# Patient Record
Sex: Male | Born: 1985 | Race: White | Marital: Married | Smoking: Former smoker
Health system: Southern US, Community
[De-identification: ages and names within clinical notes are randomized; demographics above are authoritative.]

---

## 2015-04-06 ENCOUNTER — Encounter (INDEPENDENT_AMBULATORY_CARE_PROVIDER_SITE_OTHER): Payer: Self-pay

## 2015-04-06 ENCOUNTER — Ambulatory Visit (INDEPENDENT_AMBULATORY_CARE_PROVIDER_SITE_OTHER): Payer: BC Managed Care – PPO | Admitting: Internal Medicine

## 2015-04-06 ENCOUNTER — Other Ambulatory Visit (INDEPENDENT_AMBULATORY_CARE_PROVIDER_SITE_OTHER): Payer: BC Managed Care – PPO

## 2015-04-06 ENCOUNTER — Encounter: Payer: Self-pay | Admitting: Internal Medicine

## 2015-04-06 VITALS — BP 108/70 | HR 85 | Ht 71.0 in | Wt 149.0 lb

## 2015-04-06 DIAGNOSIS — J45991 Cough variant asthma: Secondary | ICD-10-CM

## 2015-04-06 DIAGNOSIS — J310 Chronic rhinitis: Secondary | ICD-10-CM | POA: Diagnosis not present

## 2015-04-06 LAB — CBC WITH DIFFERENTIAL/PLATELET
BASOS ABS: 0 10*3/uL (ref 0.0–0.1)
Basophils Relative: 0.6 % (ref 0.0–3.0)
EOS ABS: 0.1 10*3/uL (ref 0.0–0.7)
Eosinophils Relative: 1.9 % (ref 0.0–5.0)
HEMATOCRIT: 42.7 % (ref 39.0–52.0)
HEMOGLOBIN: 14.5 g/dL (ref 13.0–17.0)
LYMPHS PCT: 30.3 % (ref 12.0–46.0)
Lymphs Abs: 1.2 10*3/uL (ref 0.7–4.0)
MCHC: 34 g/dL (ref 30.0–36.0)
MCV: 88.6 fl (ref 78.0–100.0)
Monocytes Absolute: 0.5 10*3/uL (ref 0.1–1.0)
Monocytes Relative: 13 % — ABNORMAL HIGH (ref 3.0–12.0)
Neutro Abs: 2.2 10*3/uL (ref 1.4–7.7)
Neutrophils Relative %: 54.2 % (ref 43.0–77.0)
Platelets: 239 10*3/uL (ref 150.0–400.0)
RBC: 4.82 Mil/uL (ref 4.22–5.81)
RDW: 12.7 % (ref 11.5–15.5)
WBC: 4.1 10*3/uL (ref 4.0–10.5)

## 2015-04-06 MED ORDER — AZELASTINE-FLUTICASONE 137-50 MCG/ACT NA SUSP: 1.0000 | Freq: Two times a day (BID) | NASAL | Status: DC

## 2015-04-06 MED ORDER — FAMOTIDINE 20 MG PO TABS: ORAL_TABLET | ORAL | Status: AC

## 2015-04-06 MED ORDER — PANTOPRAZOLE SODIUM 40 MG PO TBEC: 40.0000 mg | DELAYED_RELEASE_TABLET | Freq: Every day | ORAL | Status: AC

## 2015-04-06 NOTE — Progress Notes (Signed)
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 Cough variant asthma.   04/06/2015 1st  Pulmonary office visit/ Hai Grabe   Chief Complaint  Patient presents with  . Pulmonary Consult    Referred by Dr. Merri Brunette. Pt c/o cough x 1 yr, worse recently with warmer weather. Cough is typically non prod and seems worse in the am. He has noticed that he coughs more after eating and showering.   coughing daily x decade, never noct worse p stir in am Narda Bonds after bfast  Allergy testing 2012 in [place] neg  Neg resp to trial with ppi  No obvious other patterns in day to day or daytime variabilty or assoc excess/ purulent sputum or mucus plugs  or cp or chest tightness, subjective wheeze overt sinus or hb symptoms. No unusual exp hx or h/o childhood pna/ asthma or knowledge of premature birth.  Sleeping ok without nocturnal  or early am exacerbation  of respiratory  c/o's or need for noct saba. Also denies any obvious fluctuation of symptoms with weather or environmental changes or other aggravating or alleviating factors except as outlined above   Current Medications, Allergies, Complete Past Medical History, Past Surgical History, Family History, and Social History were reviewed in [employer] [facility] record.                Review of Systems  Constitutional: Negative for fever,  chills, activity change, appetite change and unexpected weight change.  HENT: Positive for congestion, sneezing and sore throat. Negative for dental problem, postnasal drip, rhinorrhea, trouble swallowing and voice change.   Eyes: Negative for visual disturbance.  Respiratory: Positive for cough. Negative for choking and shortness of breath.   Cardiovascular: Negative for chest pain and leg swelling.  Gastrointestinal: Negative for nausea, vomiting and abdominal pain.  Genitourinary: Negative for difficulty urinating.  Musculoskeletal: Negative for arthralgias.  Skin: Negative for rash.  Psychiatric/Behavioral: Negative for behavioral problems and confusion.       Objective:   Physical Exam  amb wm nad  Wt Readings from Last 3 Encounters:  04/06/15 149 lb (67.586 kg)    Vital signs reviewed   HEENT: nl dentition, turbinates, and oropharynx. Nl external ear canals without cough reflex   NECK :  without JVD/Nodes/TM/ nl carotid upstrokes bilaterally   LUNGS: no acc muscle use,  Nl contour chest which is clear to A and P bilaterally without cough on insp or exp maneuvers   CV:  RRR  no s3 or murmur or increase in P2, no edema   ABD:  soft and nontender with nl inspiratory excursion in the supine position. No bruits or organomegaly, bowel sounds nl  MS:  Nl gait/ ext warm without deformities, calf tenderness, cyanosis or clubbing No obvious joint restrictions   SKIN: warm  and dry without lesions    NEURO:  alert, approp, nl sensorium with  no motor deficits     Labs ordered 04/06/2015 = Eos 0.1/ allergy profile       Assessment & Plan:

## 2015-04-06 NOTE — Patient Instructions (Addendum)
Stop nasonex and start dymista one twice daily on a trial basis and fill the prescription if you like it   Pantoprazole (protonix) 40 mg   Take  30-60 min before first meal of the day and Pepcid (famotidine)  20 mg one @  bedtime until you have your methacholine challenge testing - this is the best way to tell whether stomach acid is contributing to your problem.    GERD (REFLUX)  is an extremely common cause of respiratory symptoms just like yours , many times with no obvious heartburn at all.    It can be treated with medication, but also with lifestyle changes including elevation of the head of your bed (ideally with 6 inch  bed blocks),  Smoking cessation, avoidance of late meals, excessive alcohol, and avoid fatty foods, chocolate, peppermint, colas, red wine, and acidic juices such as orange juice.  NO MINT OR MENTHOL PRODUCTS SO NO COUGH DROPS  USE SUGARLESS CANDY INSTEAD (Jolley ranchers or Stover's or Life Savers) or even ice chips will also do - the key is to swallow to prevent all throat clearing. NO OIL BASED VITAMINS - use powdered substitutes.  Please see patient coordinator before you leave today  to schedule methacholine challenge test - in two weeks, no sooner - I will call you with results   Please remember to go to the lab   department downstairs for your tests - we will call you with the results when they are available.

## 2015-04-07 DIAGNOSIS — J310 Chronic rhinitis: Secondary | ICD-10-CM | POA: Insufficient documentation

## 2015-04-07 NOTE — Assessment & Plan Note (Addendum)
Spirometry 04/06/2015  Completely wnl including fef 25-75  Allergy profile 04/06/2015 >  Eos 0.1 /  IgE  Pending   Symptoms are markedly disproportionate to objective findings and not clear this is a lung problem but pt does appear to have difficult airway management issues. DDX of  difficult airways management almost all start with A and  include Adherence, Ace Inhibitors, Acid Reflux, Active Sinus Disease, Alpha 1 Antitripsin deficiency, Anxiety masquerading as Airways dz,  ABPA,  Allergy(esp in young), Aspiration (esp in elderly), Adverse effects of meds,  Active smokers, A bunch of PE's (a small clot burden can't cause this syndrome unless there is already severe underlying pulm or vascular dz with poor reserve) plus two Bs  = Bronchiectasis and Beta blocker use..and one C= CHF  Adherence is always the initial "prime suspect" and is a multilayered concern that requires a "trust but verify" approach in every patient - starting with knowing how to use medications, especially inhalers, correctly, keeping up with refills and understanding the fundamental difference between maintenance and prns vs those medications only taken for a very short course and then stopped and not refilled.   ? Acid (or non-acid) GERD > always difficult to exclude as up to 75% of pts in some series report no assoc GI/ Heartburn symptoms> rec max (24h)  acid suppression and diet restrictions (for nonacid GERD/ reviewed and instructions given in writing > needs to stay on these until after Methacholine challenge then ok to d/c  ? Allergy/asthma > check profile, proceed with MCT  ? Anxiety > usually at the bottom of this list of usual suspects but should be much higher on this pt's based on H and P  > Follow up per Primary Care planned    Total time devoted to counseling  = 35/57m review case with pt/ discussion of options/alternatives/ personally creating in presence of pt  then going over specific  Instructions directly with the  pt including how to use all of the meds but in particular covering each new medication in detail (see avs)

## 2015-04-07 NOTE — Assessment & Plan Note (Signed)
Trial of dymista 04/06/2015 >>>  Consider referral to [place] if allergy studies all neg

## 2015-04-09 LAB — RESPIRATORY ALLERGY PROFILE REGION II ~~LOC~~
Allergen, Cedar tree, t12: <0.1 kU/L
Allergen, D pternoyssinus,d7: <0.1 kU/L
Allergen, Mouse Urine Protein, e78: <0.1 kU/L
Allergen, Oak,t7: <0.1 kU/L
Cat Dander: <0.1 kU/L
D. farinae: <0.1 kU/L
Dog Dander: <0.1 kU/L
Elm IgE: <0.1 kU/L
IGE (IMMUNOGLOBULIN E), SERUM: 4 kU/L (ref <115)
Johnson Grass: <0.1 kU/L
Pecan/Hickory Tree IgE: <0.1 kU/L
Rough Pigweed  IgE: <0.1 kU/L
Sheep Sorrel IgE: <0.1 kU/L
Timothy Grass: <0.1 kU/L

## 2015-04-09 NOTE — Progress Notes (Signed)
Quick Note:  Spoke with pt and notified of results per Dr. Wert. Pt verbalized understanding and denied any questions.  ______ 

## 2015-04-25 ENCOUNTER — Ambulatory Visit (HOSPITAL_COMMUNITY)
Admission: RE | Admit: 2015-04-25 | Discharge: 2015-04-25 | Disposition: A | Discharge status: Home / Self Care | Payer: BC Managed Care – PPO | Source: Ambulatory Visit | Attending: Internal Medicine | Admitting: Internal Medicine

## 2015-04-25 DIAGNOSIS — J45991 Cough variant asthma: Secondary | ICD-10-CM

## 2015-04-25 LAB — PULMONARY FUNCTION TEST
FEF 25-75 POST: 5.78 L/s
FEF 25-75 Pre: 6.08 L/sec
FEF2575-%Change-Post: -4 %
FEF2575-%Pred-Post: 125 %
FEF2575-%Pred-Pre: 131 %
FEV1-%Change-Post: -2 %
FEV1-%PRED-PRE: 113 %
FEV1-%Pred-Post: 110 %
FEV1-POST: 4.99 L
FEV1-PRE: 5.13 L
FEV1FVC-%Change-Post: 4 %
FEV1FVC-%Pred-Pre: 110 %
FEV6-%CHANGE-POST: -6 %
FEV6-%PRED-PRE: 103 %
FEV6-%Pred-Post: 96 %
FEV6-POST: 5.27 L
FEV6-Pre: 5.66 L
FEV6FVC-%PRED-POST: 101 %
FEV6FVC-%PRED-PRE: 101 %
FVC-%Change-Post: -6 %
FVC-%PRED-POST: 95 %
FVC-%PRED-PRE: 102 %
FVC-PRE: 5.66 L
FVC-Post: 5.27 L
POST FEV6/FVC RATIO: 100 %
PRE FEV1/FVC RATIO: 91 %
Post FEV1/FVC ratio: 95 %
Pre FEV6/FVC Ratio: 100 %

## 2015-04-25 MED ORDER — METHACHOLINE 16 MG/ML NEB SOLN
2.0000 mL | Freq: Once | RESPIRATORY_TRACT | Status: AC
  Administered 2015-04-25: 32 mg via RESPIRATORY_TRACT

## 2015-04-25 MED ORDER — ALBUTEROL SULFATE (2.5 MG/3ML) 0.083% IN NEBU
2.5000 mg | INHALATION_SOLUTION | Freq: Once | RESPIRATORY_TRACT | Status: AC
  Administered 2015-04-25: 2.5 mg via RESPIRATORY_TRACT

## 2015-04-25 MED ORDER — METHACHOLINE 1 MG/ML NEB SOLN
2.0000 mL | Freq: Once | RESPIRATORY_TRACT | Status: AC
  Administered 2015-04-25: 2 mg via RESPIRATORY_TRACT

## 2015-04-25 MED ORDER — METHACHOLINE 0.25 MG/ML NEB SOLN
2.0000 mL | Freq: Once | RESPIRATORY_TRACT | Status: AC
  Administered 2015-04-25: 0.5 mg via RESPIRATORY_TRACT

## 2015-04-25 MED ORDER — METHACHOLINE 4 MG/ML NEB SOLN
2.0000 mL | Freq: Once | RESPIRATORY_TRACT | Status: AC
  Administered 2015-04-25: 8 mg via RESPIRATORY_TRACT

## 2015-04-25 MED ORDER — SODIUM CHLORIDE 0.9 % IN NEBU
3.0000 mL | INHALATION_SOLUTION | Freq: Once | RESPIRATORY_TRACT | Status: AC
  Administered 2015-04-25: 3 mL via RESPIRATORY_TRACT

## 2015-04-25 MED ORDER — METHACHOLINE 0.0625 MG/ML NEB SOLN
2.0000 mL | Freq: Once | RESPIRATORY_TRACT | Status: AC
  Administered 2015-04-25: 0.125 mg via RESPIRATORY_TRACT

## 2015-04-26 ENCOUNTER — Telehealth: Payer: Self-pay | Admitting: Internal Medicine

## 2015-04-26 NOTE — Telephone Encounter (Signed)
Spoke with pt, aware of results/recs.  Scheduled rov with MW.  Nothing further needed.

## 2015-04-26 NOTE — Telephone Encounter (Signed)
Patient returned call, CB is [phone]

## 2015-04-26 NOTE — Telephone Encounter (Signed)
Per PFT results: Result Note     Call patient : Study is unremarkable, no evidence asthma > needs f/u to go over in more detail/ plan going forward  --  LMOMTCB x1

## 2015-05-18 ENCOUNTER — Encounter: Payer: Self-pay | Admitting: Internal Medicine

## 2015-05-18 ENCOUNTER — Ambulatory Visit (INDEPENDENT_AMBULATORY_CARE_PROVIDER_SITE_OTHER): Payer: BC Managed Care – PPO | Admitting: Internal Medicine

## 2015-05-18 VITALS — BP 110/82 | HR 88 | Ht 71.0 in | Wt 146.6 lb

## 2015-05-18 DIAGNOSIS — J310 Chronic rhinitis: Secondary | ICD-10-CM

## 2015-05-18 DIAGNOSIS — J45991 Cough variant asthma: Secondary | ICD-10-CM | POA: Diagnosis not present

## 2015-05-18 NOTE — Patient Instructions (Addendum)
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

## 2015-05-18 NOTE — Progress Notes (Signed)
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 Cough variant asthma.   04/06/2015 1st [place] [facility] office visit/ Stephen Gallagher   Chief Complaint  Patient presents with  . Pulmonary Consult    Referred by Dr. Merri BrunetteWalter Pharr. Pt c/o cough x 1 yr, worse recently with warmer weather. Cough is typically non prod and seems worse in the am. He has noticed that he coughs more after eating and showering.   coughing daily x decade, never noct worse p stir in am Narda Bonds/worse after bfast and after shower  [place]ergy testing 2012 in [place] neg skin pricks 2012 Neg resp to trial with ppi rec Stop nasonex and start dymista one twice daily on a trial basis and fill the prescription if you like it  Pantoprazole (protonix) 40 mg   Take  30-60 min before first meal of the day and Pepcid (famotidine)  20 mg one @  bedtime until you have your methacholine challenge testing - this is the best way to tell whether stomach acid is contributing to your problem.   GERD diet  mct on reflux > neg MCT    05/18/2015  f/u ov/Stephen Gallagher re: cough x 10 years  Chief Complaint  Patient presents with  . Follow-up    Cough is the same since last OV- no change. Pt reports having the Flu last week - some incr cough since flu and diagnosed with pharyngitis. Completed Amox 500mg  today   cough is dry/ no  better with dymista although did well in terms of stopping sensation of dripping/  Cough is daily regardless of environment /weather  No obvious day to day or daytime variability or assoc sob  or cp or chest tightness, subjective wheeze or overt sinus or hb symptoms. No unusual exp hx or h/o childhood pna/ asthma or knowledge of premature birth.  Sleeping ok without nocturnal  or early am exacerbation  of respiratory  c/o's or need for noct saba. Also denies any obvious fluctuation of symptoms with weather or environmental changes or other aggravating or alleviating factors except as outlined above   Current Medications, Allergies, Complete Past Medical History, Past Surgical History, Family History, and Social History were reviewed in [facility] electronic medical record.  ROS  The following are not active complaints unless bolded sore throat, dysphagia, dental problems, itching, sneezing,  nasal congestion or excess/ purulent secretions, ear ache,   fever, chills, sweats, unintended wt loss, classically pleuritic or exertional cp, hemoptysis,  orthopnea pnd or leg swelling, presyncope, palpitations, abdominal pain, anorexia, nausea, vomiting, diarrhea  or change in bowel or bladder habits, change in stools or urine, dysuria,hematuria,  rash, arthralgias, visual complaints, headache, numbness, weakness or ataxia or problems with walking or coordination,  change in  mood/affect or memory.           Objective:   Physical Exam  amb wm nad  Wt Readings from Last 3 Encounters:  05/18/15 146 lb 9.6 oz (66.497 kg)  04/06/15 149 lb (67.586 kg)    Vital signs reviewed      HEENT: nl dentition, turbinates, and oropharynx. Nl external ear canals without cough reflex   NECK :  without JVD/Nodes/TM/ nl carotid upstrokes bilaterally   LUNGS: no acc muscle use,  Nl contour chest which is clear to A and P bilaterally without cough on insp or exp maneuvers   CV:  RRR  no s3 or murmur or  increase in P2, no edema   ABD:  soft and nontender with nl inspiratory excursion in the supine position. No bruits or organomegaly, bowel sounds nl  MS:  Nl gait/ ext warm without deformities, calf tenderness, cyanosis or clubbing No obvious joint restrictions   SKIN: warm and dry without lesions    NEURO:  alert, approp, nl sensorium with  no motor deficits            Assessment & Plan:

## 2015-05-20 NOTE — Assessment & Plan Note (Signed)
Trial of dymista 04/06/2015 > resolved but not covered > rec generic components

## 2015-05-20 NOTE — Assessment & Plan Note (Signed)
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

## 2016-08-21 ENCOUNTER — Other Ambulatory Visit: Payer: Self-pay | Admitting: Family

## 2016-08-21 DIAGNOSIS — R1031 Right lower quadrant pain: Secondary | ICD-10-CM

## 2016-08-25 ENCOUNTER — Ambulatory Visit
Admission: RE | Admit: 2016-08-25 | Discharge: 2016-08-25 | Disposition: A | Discharge status: Home / Self Care | Payer: BC Managed Care – PPO | Source: Ambulatory Visit | Attending: Family | Admitting: Family

## 2016-08-25 DIAGNOSIS — R1031 Right lower quadrant pain: Secondary | ICD-10-CM

## 2017-07-20 IMAGING — US US PELVIS LIMITED
1 series · 13 of 13 positions shown · non-contrast
Comparison: None.

CLINICAL DATA: Pain in the region of the right pelvis/ groin with
history of prior hernia repair with mesh.

EXAM:
ULTRASOUND OF THE MALE PELVIS

[Series 1: us pelvis limited · 0.05mm/px · 13 acquisitions, 13 frames shown]
[im 1/13]
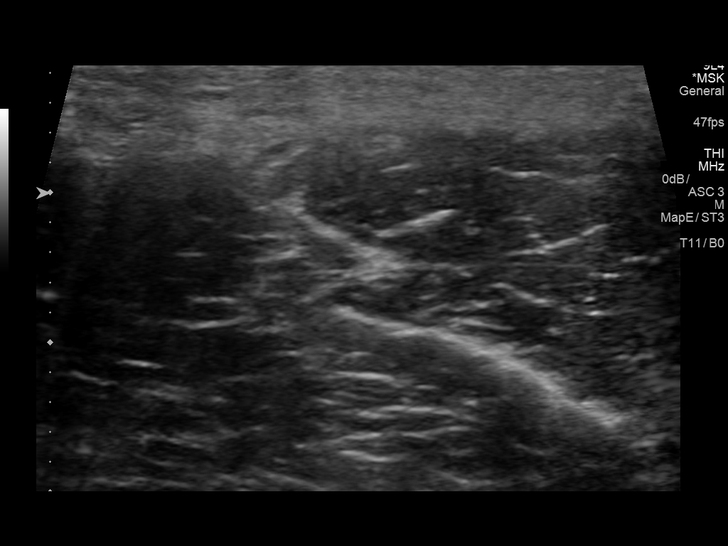
[im 2/13]
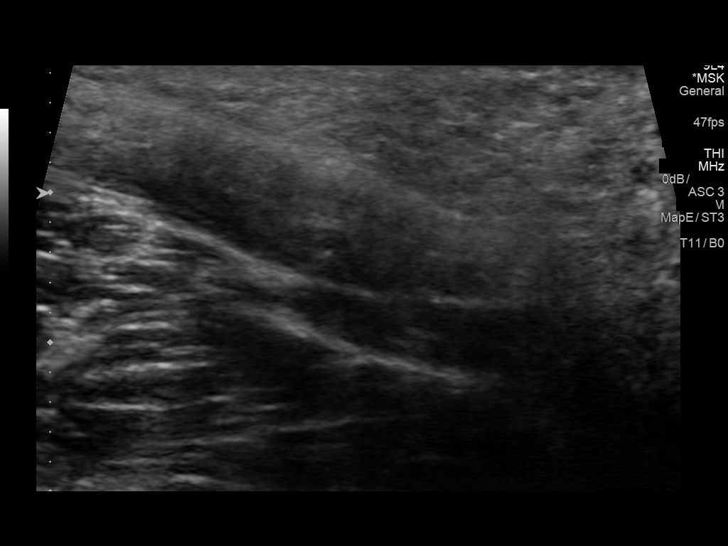
[im 3/13]
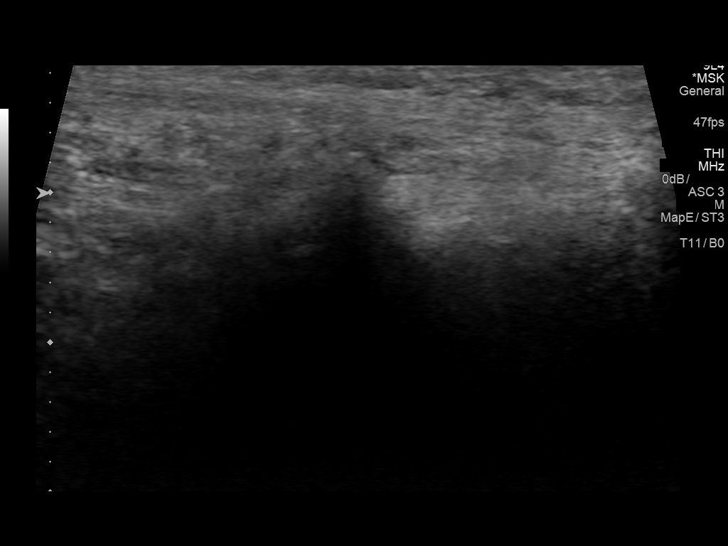
[im 4/13]
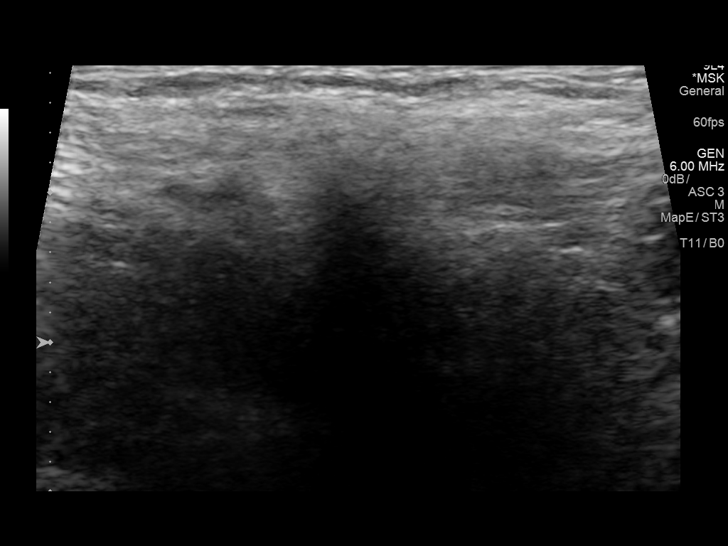
[im 5/13]
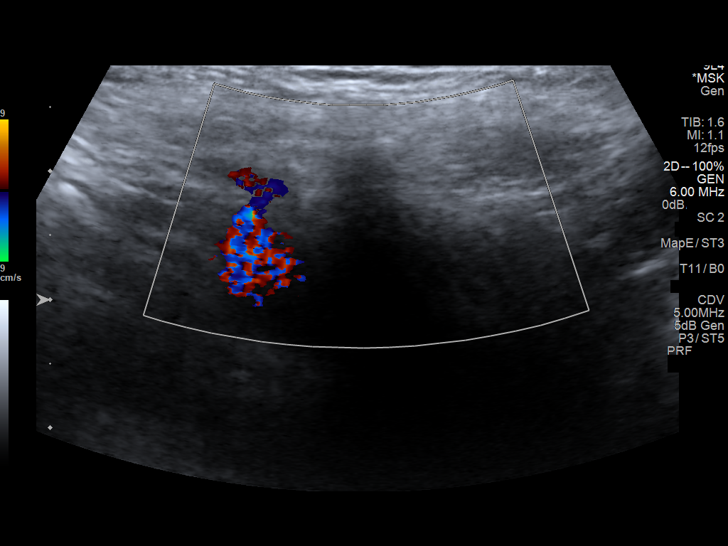
[im 6/13]
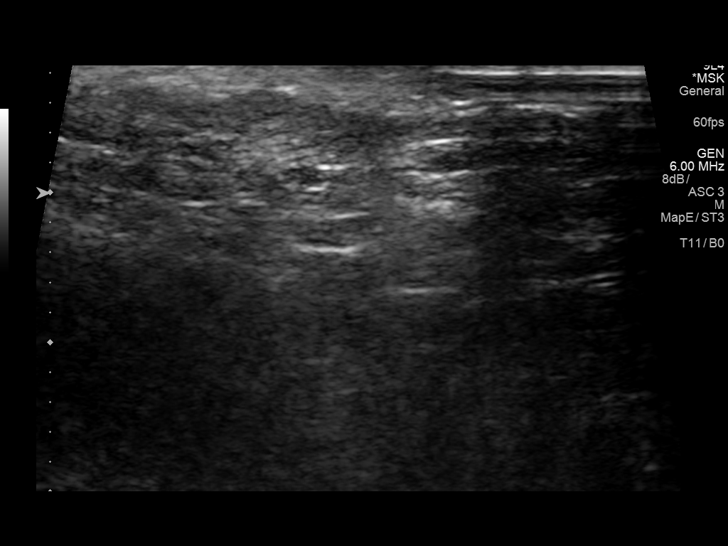
[im 7/13]
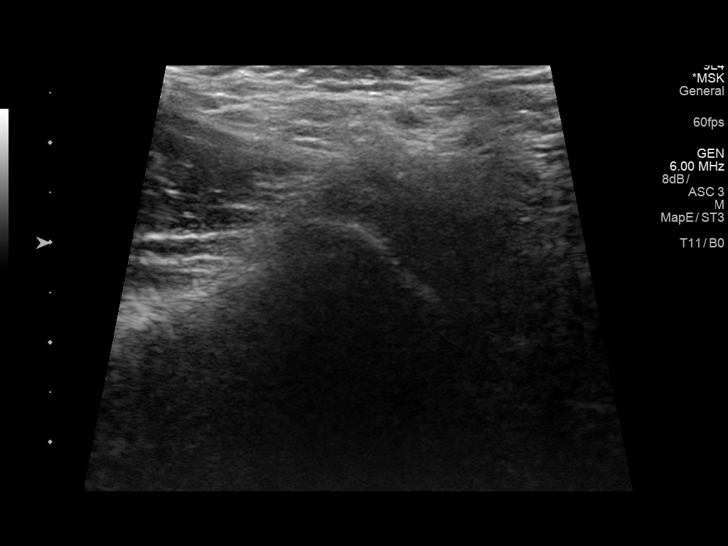
[im 8/13]
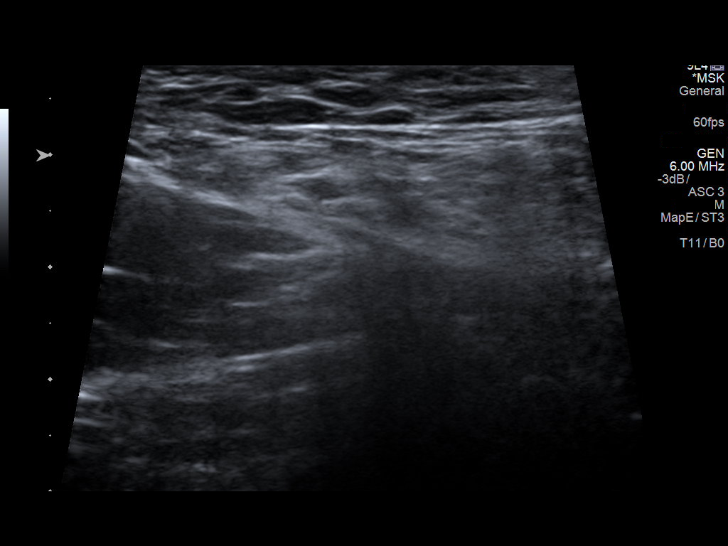
[im 9/13]
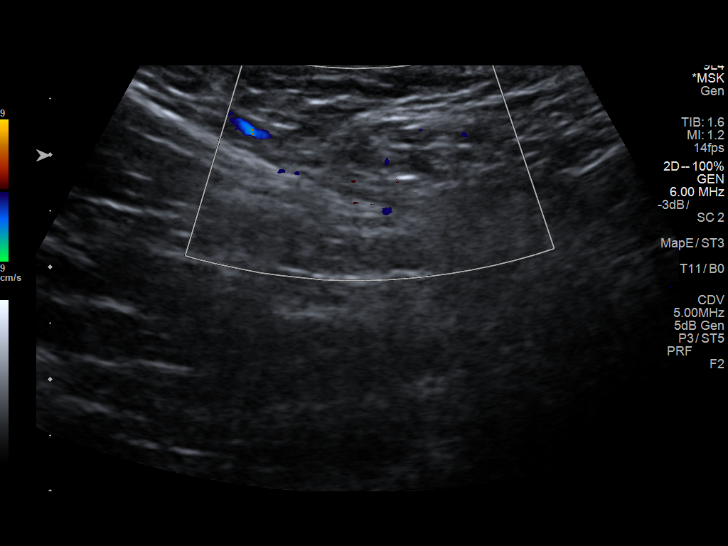
[im 10/13]
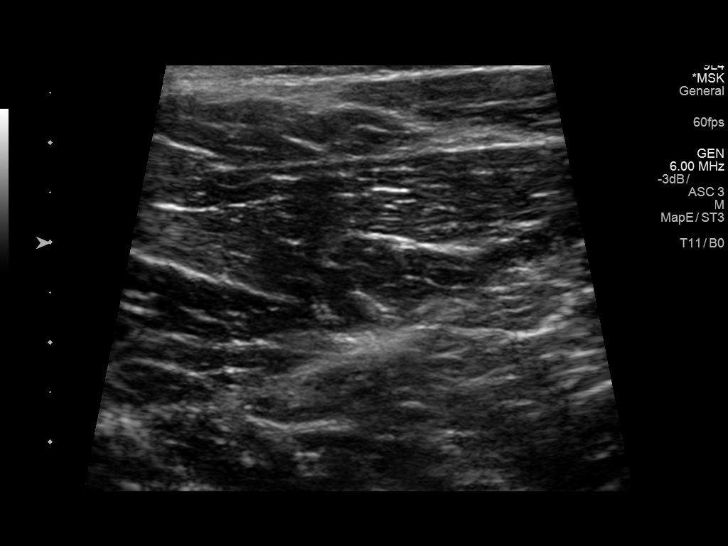
[im 11/13]
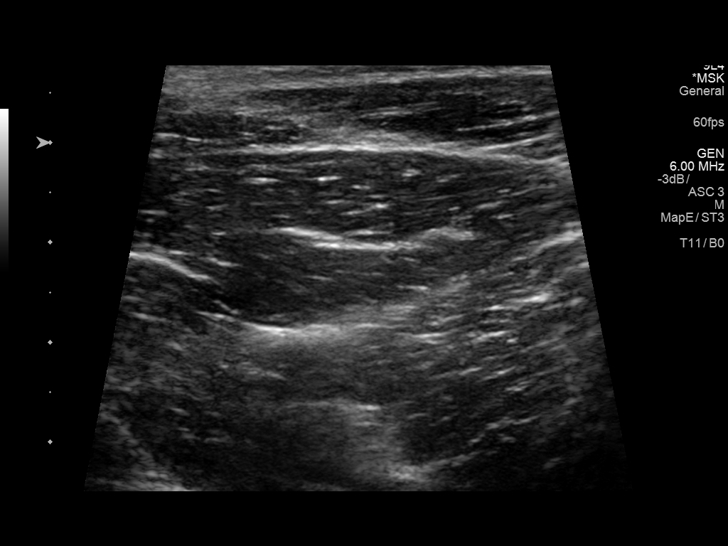
[im 12/13]
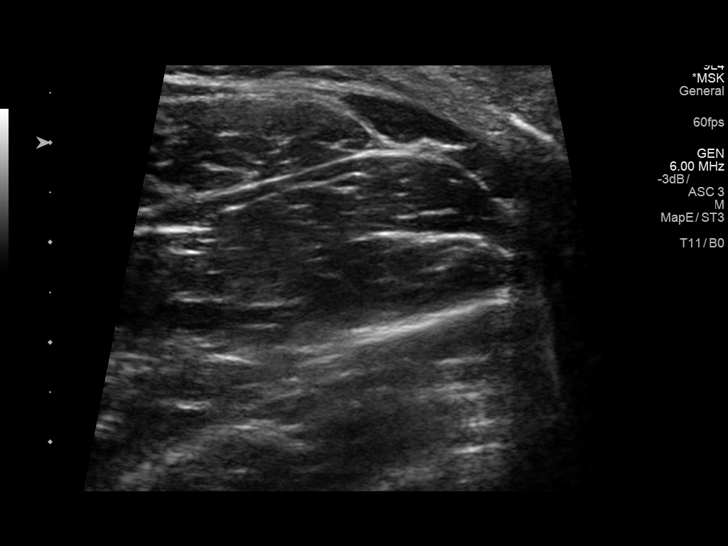
[im 13/13]
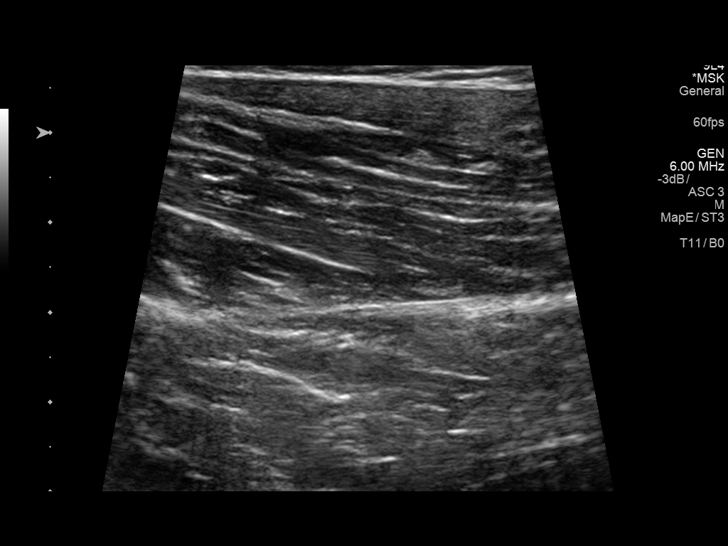

[13 of 13 positions shown; findings below may reference images not displayed]

FINDINGS: Soft tissue ultrasound in the region of pain demonstrates no
evidence of obvious hernia, abnormal fluid collection, soft tissue
mass, lymphadenopathy or abnormal vascular finding.
IMPRESSION: Unremarkable ultrasound in the region of patient discomfort. If
there is high suspicion for recurrent hernia or other process in
this region, CT would be more sensitive in detecting potential
recurrent hernia or other process.
